# Patient Record
Sex: Female | Born: 1980 | Race: Black or African American | Hispanic: No | Marital: Single | Smoking: Never smoker
Health system: Southern US, Community
[De-identification: ages and names within clinical notes are randomized; demographics above are authoritative.]

## PROBLEM LIST (undated history)

## (undated) DIAGNOSIS — Z86718 Personal history of other venous thrombosis and embolism: Secondary | ICD-10-CM

## (undated) HISTORY — PX: COLON RESECTION: SHX5231

---

## 2008-02-18 ENCOUNTER — Emergency Department (HOSPITAL_COMMUNITY): Admission: EM | Admit: 2008-02-18 | Discharge: 2008-02-18 | Payer: Self-pay | Admitting: Emergency Medicine

## 2008-04-07 ENCOUNTER — Inpatient Hospital Stay (HOSPITAL_COMMUNITY): Admission: AD | Admit: 2008-04-07 | Discharge: 2008-04-07 | Payer: Self-pay | Admitting: Family Medicine

## 2008-04-09 ENCOUNTER — Inpatient Hospital Stay (HOSPITAL_COMMUNITY): Admission: AD | Admit: 2008-04-09 | Discharge: 2008-04-09 | Payer: Self-pay | Admitting: Obstetrics & Gynecology

## 2008-04-12 ENCOUNTER — Inpatient Hospital Stay (HOSPITAL_COMMUNITY): Admission: AD | Admit: 2008-04-12 | Discharge: 2008-04-12 | Payer: Self-pay | Admitting: Obstetrics & Gynecology

## 2008-07-26 ENCOUNTER — Emergency Department (HOSPITAL_COMMUNITY): Admission: EM | Admit: 2008-07-26 | Discharge: 2008-07-26 | Payer: Self-pay | Admitting: Emergency Medicine

## 2009-04-19 ENCOUNTER — Inpatient Hospital Stay (HOSPITAL_COMMUNITY): Admission: AD | Admit: 2009-04-19 | Discharge: 2009-04-19 | Payer: Self-pay | Admitting: Obstetrics & Gynecology

## 2010-07-17 ENCOUNTER — Encounter: Payer: Self-pay | Admitting: Obstetrics & Gynecology
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
## 2010-09-29 LAB — URINALYSIS, ROUTINE W REFLEX MICROSCOPIC
Bilirubin Urine: NEGATIVE
Ketones, ur: NEGATIVE mg/dL
Nitrite: NEGATIVE
Protein, ur: NEGATIVE mg/dL
Specific Gravity, Urine: <1.005 — ABNORMAL LOW (ref 1.005–1.030)
Urobilinogen, UA: 0.2 mg/dL (ref 0.0–1.0)
pH: 7 (ref 5.0–8.0)

## 2010-10-10 LAB — URINALYSIS, ROUTINE W REFLEX MICROSCOPIC
Glucose, UA: NEGATIVE mg/dL
Ketones, ur: NEGATIVE mg/dL
Leukocytes, UA: NEGATIVE
Protein, ur: NEGATIVE mg/dL
Urobilinogen, UA: 0.2 mg/dL (ref 0.0–1.0)

## 2010-10-10 LAB — POCT PREGNANCY, URINE: Preg Test, Ur: NEGATIVE

## 2010-10-10 LAB — URINE MICROSCOPIC-ADD ON

## 2011-03-28 LAB — CBC
Platelets: 217
RBC: 3.61 — ABNORMAL LOW
WBC: 8

## 2011-03-28 LAB — HCG, QUANTITATIVE, PREGNANCY: hCG, Beta Chain, Quant, S: 375 — ABNORMAL HIGH

## 2011-03-28 LAB — POCT PREGNANCY, URINE: Preg Test, Ur: POSITIVE

## 2011-03-28 LAB — WET PREP, GENITAL
Trich, Wet Prep: NONE SEEN
Yeast Wet Prep HPF POC: NONE SEEN

## 2011-03-28 LAB — URINALYSIS, ROUTINE W REFLEX MICROSCOPIC
Nitrite: NEGATIVE
Specific Gravity, Urine: 1.02
Urobilinogen, UA: 0.2
pH: 7

## 2011-03-28 LAB — GC/CHLAMYDIA PROBE AMP, GENITAL: Chlamydia, DNA Probe: NEGATIVE

## 2011-03-28 LAB — RPR: RPR Ser Ql: NONREACTIVE

## 2011-03-28 LAB — ABO/RH: ABO/RH(D): A POS

## 2014-01-02 ENCOUNTER — Inpatient Hospital Stay (HOSPITAL_COMMUNITY)
Admission: AD | Admit: 2014-01-02 | Discharge: 2014-01-02 | Disposition: A | Discharge status: Home / Self Care | Payer: Self-pay | Source: Ambulatory Visit | Attending: Obstetrics & Gynecology | Admitting: Obstetrics & Gynecology

## 2014-01-02 ENCOUNTER — Encounter (HOSPITAL_COMMUNITY): Payer: Self-pay | Admitting: *Deleted

## 2014-01-02 DIAGNOSIS — Z86718 Personal history of other venous thrombosis and embolism: Secondary | ICD-10-CM | POA: Insufficient documentation

## 2014-01-02 DIAGNOSIS — R1084 Generalized abdominal pain: Secondary | ICD-10-CM | POA: Insufficient documentation

## 2014-01-02 HISTORY — DX: Personal history of other venous thrombosis and embolism: Z86.718

## 2014-01-02 LAB — URINALYSIS, ROUTINE W REFLEX MICROSCOPIC
BILIRUBIN URINE: NEGATIVE
Glucose, UA: NEGATIVE mg/dL
HGB URINE DIPSTICK: NEGATIVE
KETONES UR: NEGATIVE mg/dL
Leukocytes, UA: NEGATIVE
NITRITE: NEGATIVE
Protein, ur: NEGATIVE mg/dL
Specific Gravity, Urine: 1.02 (ref 1.005–1.030)
UROBILINOGEN UA: 1 mg/dL (ref 0.0–1.0)
pH: 6.5 (ref 5.0–8.0)

## 2014-01-02 LAB — WET PREP, GENITAL
TRICH WET PREP: NONE SEEN
Yeast Wet Prep HPF POC: NONE SEEN

## 2014-01-02 LAB — POCT PREGNANCY, URINE: Preg Test, Ur: NEGATIVE

## 2014-01-02 NOTE — MAU Provider Note (Signed)
History     CSN: [id number]  Arrival date and time: 01/02/14 1731   First Provider Initiated Contact with Patient 01/02/14 1812      Chief Complaint  Patient presents with  . Abdominal Cramping  . Dizziness  . Nausea   HPI  Katie Gould is a [age] y.o. [id number] who presents today with cramping, nausea and dizziness. She states that the cramping started about 2 days ago, and last week she was nauseous and dizzy. She also experienced the nausea and dizziness today. She denies any fever, nausea or vomiting. She states that she had surgery to remove part of her intestines last year. 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   Past Medical History  Diagnosis Date  . History of blood clots     Past Surgical History  Procedure Laterality Date  . Colon resection      Pt stated she had a blood clot in her intestins from birth controll pills    No family history on file.  History  Substance Use Topics  . Smoking status: Never Smoker   . Smokeless tobacco: Not on file  . Alcohol Use: No    Allergies: Allergies not on file  No prescriptions prior to admission    ROS Physical Exam   Blood pressure 123/71, pulse 84, temperature 99.4 F (37.4 C), temperature source Oral, resp. rate 16, height 5\' 3"  (1.6 m), weight 80.74 kg (178 lb), last menstrual period 12/02/2013.  Physical Exam  Nursing note and vitals reviewed. Constitutional: She is oriented to person, place, and time. She appears well-developed and well-nourished. No distress.  Cardiovascular: Normal rate.   Respiratory: Effort normal.  GI: Soft. There is no tenderness. There is no rebound.  Genitourinary:   External: no lesion Vagina: small amount of white discharge Cervix: pink, smooth, no CMT Uterus: NSSC Adnexa: NT   Neurological: She is alert and oriented to person, place, and time.  Skin: Skin is warm and dry.  Psychiatric: She has a normal mood and  affect.    MAU Course  Procedures Results for orders placed during the hospital encounter of 01/02/14 (from the past 24 hour(s))  URINALYSIS, ROUTINE W REFLEX MICROSCOPIC     Status: None   Collection Time    01/02/14  5:50 PM      Result Value Ref Range   Color, Urine YELLOW  YELLOW   APPearance CLEAR  CLEAR   Specific Gravity, Urine 1.020  1.005 - 1.030   pH 6.5  5.0 - 8.0   Glucose, UA NEGATIVE  NEGATIVE mg/dL   Hgb urine dipstick NEGATIVE  NEGATIVE   Bilirubin Urine NEGATIVE  NEGATIVE   Ketones, ur NEGATIVE  NEGATIVE mg/dL   Protein, ur NEGATIVE  NEGATIVE mg/dL   Urobilinogen, UA 1.0  0.0 - 1.0 mg/dL   Nitrite NEGATIVE  NEGATIVE   Leukocytes, UA NEGATIVE  NEGATIVE  POCT PREGNANCY, URINE     Status: None   Collection Time    01/02/14  6:05 PM      Result Value Ref Range   Preg Test, Ur NEGATIVE  NEGATIVE  WET PREP, GENITAL     Status: Abnormal   Collection Time    01/02/14  6:15 PM      Result Value Ref Range   Yeast Wet Prep HPF POC NONE SEEN  NONE SEEN   Trich, Wet Prep NONE SEEN  NONE SEEN   Clue Cells Wet  Prep HPF POC FEW (*) NONE SEEN   WBC, Wet Prep HPF POC FEW (*) NONE SEEN     Assessment and Plan   1. Generalized abdominal pain    Probable scar tissue from surgery FU with GI as needed Return to MAU as needed   Tawnya Crook 01/02/2014, 6:13 PM

## 2014-01-02 NOTE — MAU Provider Note (Signed)
Attestation of Attending Supervision of Advanced Practitioner (PA/CNM/NP): Evaluation and management procedures were performed by the Advanced Practitioner under my supervision and collaboration.  I have reviewed the Advanced Practitioner's note and chart, and I agree with the management and plan.  Chris Cripps, MD, FACOG Attending Obstetrician & Gynecologist Faculty Practice, [facility]   

## 2014-01-02 NOTE — Discharge Instructions (Signed)
Abdominal Pain, Women °Abdominal (stomach, pelvic, or belly) pain can be caused by many things. It is important to tell your doctor: °· The location of the pain. °· Does it come and go or is it present all the time? °· Are there things that start the pain (eating certain foods, exercise)? °· Are there other symptoms associated with the pain (fever, nausea, vomiting, diarrhea)? °All of this is helpful to know when trying to find the cause of the pain. °CAUSES  °· Stomach: virus or bacteria infection, or ulcer. °· Intestine: appendicitis (inflamed appendix), regional ileitis (Crohn's disease), ulcerative colitis (inflamed colon), irritable bowel syndrome, diverticulitis (inflamed diverticulum of the colon), or cancer of the stomach or intestine. °· Gallbladder disease or stones in the gallbladder. °· Kidney disease, kidney stones, or infection. °· Pancreas infection or cancer. °· Fibromyalgia (pain disorder). °· Diseases of the female organs: °¨ Uterus: fibroid (non-cancerous) tumors or infection. °¨ Fallopian tubes: infection or tubal pregnancy. °¨ Ovary: cysts or tumors. °¨ Pelvic adhesions (scar tissue). °¨ Endometriosis (uterus lining tissue growing in the pelvis and on the pelvic organs). °¨ Pelvic congestion syndrome (female organs filling up with blood just before the menstrual period). °¨ Pain with the menstrual period. °¨ Pain with ovulation (producing an egg). °¨ Pain with an IUD (intrauterine device, birth control) in the uterus. °¨ Cancer of the female organs. °· Functional pain (pain not caused by a disease, may improve without treatment). °· Psychological pain. °· Depression. °DIAGNOSIS  °Your doctor will decide the seriousness of your pain by doing an examination. °· Blood tests. °· X-rays. °· Ultrasound. °· CT scan (computed tomography, special type of X-ray). °· MRI (magnetic resonance imaging). °· Cultures, for infection. °· Barium enema (dye inserted in the large intestine, to better view it with  X-rays). °· Colonoscopy (looking in intestine with a lighted tube). °· Laparoscopy (minor surgery, looking in abdomen with a lighted tube). °· Major abdominal exploratory surgery (looking in abdomen with a large incision). °TREATMENT  °The treatment will depend on the cause of the pain.  °· Many cases can be observed and treated at home. °· Over-the-counter medicines recommended by your caregiver. °· Prescription medicine. °· Antibiotics, for infection. °· Birth control pills, for painful periods or for ovulation pain. °· Hormone treatment, for endometriosis. °· Nerve blocking injections. °· Physical therapy. °· Antidepressants. °· Counseling with a psychologist or psychiatrist. °· Minor or major surgery. °HOME CARE INSTRUCTIONS  °· Do not take laxatives, unless directed by your caregiver. °· Take over-the-counter pain medicine only if ordered by your caregiver. Do not take aspirin because it can cause an upset stomach or bleeding. °· Try a clear liquid diet (broth or water) as ordered by your caregiver. Slowly move to a bland diet, as tolerated, if the pain is related to the stomach or intestine. °· Have a thermometer and take your temperature several times a day, and record it. °· Bed rest and sleep, if it helps the pain. °· Avoid sexual intercourse, if it causes pain. °· Avoid stressful situations. °· Keep your follow-up appointments and tests, as your caregiver orders. °· If the pain does not go away with medicine or surgery, you may try: °¨ Acupuncture. °¨ Relaxation exercises (yoga, meditation). °¨ Group therapy. °¨ Counseling. °SEEK MEDICAL CARE IF:  °· You notice certain foods cause stomach pain. °· Your home care treatment is not helping your pain. °· You need stronger pain medicine. °· You want your IUD removed. °· You feel faint or   lightheaded. °· You develop nausea and vomiting. °· You develop a rash. °· You are having side effects or an allergy to your medicine. °SEEK IMMEDIATE MEDICAL CARE IF:  °· Your  pain does not go away or gets worse. °· You have a fever. °· Your pain is felt only in portions of the abdomen. The right side could possibly be appendicitis. The left lower portion of the abdomen could be colitis or diverticulitis. °· You are passing blood in your stools (bright red or black tarry stools, with or without vomiting). °· You have blood in your urine. °· You develop chills, with or without a fever. °· You pass out. °MAKE SURE YOU:  °· Understand these instructions. °· Will watch your condition. °· Will get help right away if you are not doing well or get worse. °Document Released: 04/09/2007 Document Revised: 09/04/2011 Document Reviewed: 04/29/2009 °[employer]® Patient Information ©2015 [employer]. This information is not intended to replace advice given to you by your health care provider. Make sure you discuss any questions you have with your health care provider. ° °

## 2014-01-02 NOTE — MAU Note (Addendum)
real bad stomach cramps, started 3-4 days ago.  Started feeling real dizzy about a wk ago, and then started feeling nauseated.  Neg HPT 2 days ago- did it at night

## 2014-01-03 LAB — GC/CHLAMYDIA PROBE AMP
CT PROBE, AMP APTIMA: NEGATIVE
GC PROBE AMP APTIMA: NEGATIVE

## 2014-04-27 ENCOUNTER — Encounter (HOSPITAL_COMMUNITY): Payer: Self-pay | Admitting: *Deleted
